# Patient Record
Sex: Male | Born: 1995 | Race: Black or African American | Hispanic: No | Marital: Single | Smoking: Never smoker
Health system: Southern US, Community
[De-identification: ages and names within clinical notes are randomized; demographics above are authoritative.]

## PROBLEM LIST (undated history)

## (undated) ENCOUNTER — Emergency Department: Admission: EM | Payer: No Typology Code available for payment source | Source: Home / Self Care

## (undated) ENCOUNTER — Emergency Department: Payer: No Typology Code available for payment source

---

## 2019-02-21 ENCOUNTER — Emergency Department
Admission: EM | Admit: 2019-02-21 | Discharge: 2019-02-21 | Disposition: A | Discharge status: Home / Self Care | Payer: Self-pay | Attending: Emergency Medicine | Admitting: Emergency Medicine

## 2019-02-21 ENCOUNTER — Encounter: Payer: Self-pay | Admitting: Emergency Medicine

## 2019-02-21 ENCOUNTER — Other Ambulatory Visit: Payer: Self-pay

## 2019-02-21 DIAGNOSIS — K0889 Other specified disorders of teeth and supporting structures: Secondary | ICD-10-CM | POA: Insufficient documentation

## 2019-02-21 MED ORDER — LIDOCAINE VISCOUS HCL 2 % MT SOLN: 20.0000 mL | OROMUCOSAL | 0 refills | Status: AC | PRN

## 2019-02-21 MED ORDER — KETOROLAC TROMETHAMINE 10 MG PO TABS
10.0000 mg | ORAL_TABLET | Freq: Once | ORAL | Status: AC
  Administered 2019-02-21: 06:00:00 10 mg via ORAL
  Filled 2019-02-21: qty 1

## 2019-02-21 MED ORDER — AMOXICILLIN 500 MG PO TABS: 500.0000 mg | ORAL_TABLET | Freq: Two times a day (BID) | ORAL | 0 refills | Status: AC

## 2019-02-21 MED ORDER — KETOROLAC TROMETHAMINE 10 MG PO TABS: 10.0000 mg | ORAL_TABLET | Freq: Four times a day (QID) | ORAL | 0 refills | Status: AC | PRN

## 2019-02-21 MED ORDER — LIDOCAINE VISCOUS HCL 2 % MT SOLN
15.0000 mL | Freq: Once | OROMUCOSAL | Status: AC
  Administered 2019-02-21: 15 mL via OROMUCOSAL
  Filled 2019-02-21: qty 15

## 2019-02-21 NOTE — ED Triage Notes (Addendum)
Pt reports broken tooth to the bottom right side of his mouth x 2 months; pain flared up 3 days ago; pt first said he'd taken nothing for the pain; then said he'd tried Tylenol with no relief; pt in no acute distress;

## 2019-02-21 NOTE — ED Provider Notes (Signed)
[facility] Emergency Department Provider Note  ____________________________________________  Time seen: Approximately 5:46 AM  I have reviewed the triage vital signs and the nursing notes.   HISTORY  Chief Complaint Dental Pain    HPI Frank Hogan is a 23 y.o. male with no significant past medical history who complains of lower left mouth pain for the past 3 days, no aggravating or alleviating factors, nonradiating.  No difficulty swallowing or breathing.  No fevers chills neck pain or headache.  No vision changes.  Notes that he has had chronically broken teeth, has not seen a dentist.      History reviewed. No pertinent past medical history.   There are no active problems to display for this patient.    History reviewed. No pertinent surgical history.   Prior to Admission medications   Medication Sig Start Date End Date Taking? Authorizing Provider  amoxicillin (AMOXIL) 500 MG tablet Take 1 tablet (500 mg total) by mouth 2 (two) times daily for 7 days. 02/21/19 02/28/19  Carrie Mew, MD  ketorolac (TORADOL) 10 MG tablet Take 1 tablet (10 mg total) by mouth every 6 (six) hours as needed for moderate pain. 02/21/19   Carrie Mew, MD  lidocaine (XYLOCAINE) 2 % solution Use as directed 20 mLs in the mouth or throat every 2 (two) hours as needed for mouth pain. Gargle and spit out 02/21/19   Carrie Mew, MD     Allergies Patient has no known allergies.   History reviewed. No pertinent family history.  Social History Social History   Tobacco Use  . Smoking status: Never Smoker  . Smokeless tobacco: Never Used  Substance Use Topics  . Alcohol use: Never    Frequency: Never  . Drug use: Never    Review of Systems  Constitutional:   No fever or chills.  ENT:   Mouth pain as above Cardiovascular:   No chest pain or syncope. Respiratory:   No dyspnea or cough. Gastrointestinal:   Negative for abdominal pain, vomiting and  diarrhea.  Musculoskeletal:   Negative for focal pain or swelling All other systems reviewed and are negative except as documented above in ROS and HPI.  ____________________________________________   PHYSICAL EXAM:  VITAL SIGNS: ED Triage Vitals  Enc Vitals Group     BP 02/21/19 0419 133/87     Pulse Rate 02/21/19 0419 (!) 55     Resp 02/21/19 0419 16     Temp 02/21/19 0419 97.7 F (36.5 C)     Temp Source 02/21/19 0419 Oral     SpO2 02/21/19 0419 99 %     Weight 02/21/19 0420 270 lb (122.5 kg)     Height 02/21/19 0420 6\' 3"  (1.905 m)     Head Circumference --      Peak Flow --      Pain Score 02/21/19 0419 10     Pain Loc --      Pain Edu? --      Excl. in [place]? --     Vital signs reviewed, nursing assessments reviewed.   Constitutional:   Alert and oriented. Non-toxic appearance. Eyes:   Conjunctivae are normal. EOMI. PERRL. ENT      Head:   Normocephalic and atraumatic.      Nose:   Unremarkable, no congestion or rhinorrhea.      Mouth/Throat:   Poor dentition.  Broken tooth and right upper molar, without inflammatory changes.  Broken tooth and left upper molar without inflammatory changes.  Broken tooth and left lower molar with surrounding gingival swelling, no fluctuance or purulent drainage.  No secondary space cellulitis.      Neck:   No meningismus. Full ROM. Hematological/Lymphatic/Immunilogical:   No cervical lymphadenopathy. Cardiovascular:   RRR. Symmetric bilateral radial and DP pulses.  No murmurs. Cap refill less than 2 seconds. Respiratory:   Normal respiratory effort without tachypnea/retractions. Breath sounds are clear and equal bilaterally. No wheezes/rales/rhonchi. Musculoskeletal:   Normal range of motion in all extremities. Neurologic:   Normal speech and language.  Motor grossly intact. No acute focal neurologic deficits are appreciated.   ____________________________________________    LABS (pertinent positives/negatives) (all labs ordered  are listed, but only abnormal results are displayed) Labs Reviewed - No data to display ____________________________________________   EKG    ____________________________________________    RADIOLOGY  No results found.  ____________________________________________   PROCEDURES Procedures  ____________________________________________    CLINICAL IMPRESSION / ASSESSMENT AND PLAN / ED COURSE  Medications ordered in the ED: Medications  ketorolac (TORADOL) tablet 10 mg (has no administration in time range)    Pertinent labs & imaging results that were available during my care of the patient were reviewed by me and considered in my medical decision making (see chart for details).  Frank Hogan was evaluated in Emergency Department on 02/21/2019 for the symptoms described in the history of present illness. He was evaluated in the context of the global COVID-19 pandemic, which necessitated consideration that the patient might be at risk for infection with the SARS-CoV-2 virus that causes COVID-19. Institutional protocols and algorithms that pertain to the evaluation of patients at risk for COVID-19 are in a state of rapid change based on information released by regulatory bodies including the CDC and federal and state organizations. These policies and algorithms were followed during the patient's care in the ED.   Patient presents with dental pain, some gingival swelling suggestive of odontogenic infection.  Will give Toradol, amoxicillin, viscous lidocaine, refer to dentistry follow-up for more definitive management.  No evidence of cellulitis or abscess or Ludwick's angina.  Doubt RPA or PTA      ____________________________________________   FINAL CLINICAL IMPRESSION(S) / ED DIAGNOSES    Final diagnoses:  Pain, dental     ED Discharge Orders         Ordered    amoxicillin (AMOXIL) 500 MG tablet  2 times daily     02/21/19 0546    lidocaine (XYLOCAINE) 2 %  solution  Every 2 hours PRN     02/21/19 0546    ketorolac (TORADOL) 10 MG tablet  Every 6 hours PRN     02/21/19 0546          Portions of this note were generated with dragon dictation software. Dictation errors may occur despite best attempts at proofreading.   Sharman Cheek, MD 02/21/19 [phone]

## 2019-02-21 NOTE — Discharge Instructions (Addendum)
[facility] can be a good resource for dental care.  It may also be worthwhile to contact the [facility] dental school.

## 2019-07-25 ENCOUNTER — Other Ambulatory Visit: Payer: Self-pay

## 2019-07-25 DIAGNOSIS — M7918 Myalgia, other site: Secondary | ICD-10-CM | POA: Insufficient documentation

## 2019-07-25 DIAGNOSIS — R519 Headache, unspecified: Secondary | ICD-10-CM | POA: Insufficient documentation

## 2019-07-25 DIAGNOSIS — Y92410 Unspecified street and highway as the place of occurrence of the external cause: Secondary | ICD-10-CM | POA: Insufficient documentation

## 2019-07-25 DIAGNOSIS — M542 Cervicalgia: Secondary | ICD-10-CM | POA: Diagnosis not present

## 2019-07-25 DIAGNOSIS — Y998 Other external cause status: Secondary | ICD-10-CM | POA: Insufficient documentation

## 2019-07-25 DIAGNOSIS — M25561 Pain in right knee: Secondary | ICD-10-CM | POA: Insufficient documentation

## 2019-07-25 DIAGNOSIS — Y9389 Activity, other specified: Secondary | ICD-10-CM | POA: Diagnosis not present

## 2019-07-25 DIAGNOSIS — R1110 Vomiting, unspecified: Secondary | ICD-10-CM | POA: Insufficient documentation

## 2019-07-25 DIAGNOSIS — R42 Dizziness and giddiness: Secondary | ICD-10-CM | POA: Insufficient documentation
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
## 2019-07-26 ENCOUNTER — Emergency Department: Payer: No Typology Code available for payment source

## 2019-07-26 ENCOUNTER — Emergency Department
Admission: EM | Admit: 2019-07-26 | Discharge: 2019-07-26 | Disposition: A | Discharge status: Home / Self Care | Payer: No Typology Code available for payment source | Attending: Emergency Medicine | Admitting: Emergency Medicine

## 2019-07-26 DIAGNOSIS — M7918 Myalgia, other site: Secondary | ICD-10-CM
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
## 2019-07-26 NOTE — ED Notes (Signed)
Pt refused to sign the e sig pad

## 2019-07-26 NOTE — Discharge Instructions (Addendum)
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
## 2020-05-10 ENCOUNTER — Other Ambulatory Visit: Payer: Self-pay

## 2020-05-10 ENCOUNTER — Emergency Department: Payer: Self-pay

## 2020-05-10 ENCOUNTER — Emergency Department
Admission: EM | Admit: 2020-05-10 | Discharge: 2020-05-10 | Disposition: A | Discharge status: Home / Self Care | Payer: Self-pay | Attending: Emergency Medicine | Admitting: Emergency Medicine

## 2020-05-10 DIAGNOSIS — Y92481 Parking lot as the place of occurrence of the external cause: Secondary | ICD-10-CM | POA: Insufficient documentation

## 2020-05-10 DIAGNOSIS — S40022A Contusion of left upper arm, initial encounter: Secondary | ICD-10-CM | POA: Insufficient documentation

## 2020-05-10 DIAGNOSIS — S40012A Contusion of left shoulder, initial encounter: Secondary | ICD-10-CM

## 2020-05-10 DIAGNOSIS — S4992XA Unspecified injury of left shoulder and upper arm, initial encounter: Secondary | ICD-10-CM | POA: Diagnosis present

## 2020-05-10 NOTE — ED Notes (Signed)
NAD noted at time of D/C. Pt denies questions or concerns. Pt ambulatory to the lobby at this time. Verbal consent for D/C obtained.  

## 2020-05-10 NOTE — Discharge Instructions (Signed)
No acute findings on x-ray of the left shoulder.  Follow discharge care instructions.  Advised over-the-counter ibuprofen or Tylenol.
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
